# Patient Record
Sex: Male | Born: 1980 | Race: White | Hispanic: No | Marital: Single | Smoking: Never smoker
Health system: Southern US, Community
[De-identification: ages and names within clinical notes are randomized; demographics above are authoritative.]

## PROBLEM LIST (undated history)

## (undated) DIAGNOSIS — E119 Type 2 diabetes mellitus without complications: Secondary | ICD-10-CM

---

## 2008-03-20 ENCOUNTER — Emergency Department (HOSPITAL_COMMUNITY): Admission: EM | Admit: 2008-03-20 | Discharge: 2008-03-20 | Payer: Self-pay | Admitting: Emergency Medicine

## 2011-02-09 LAB — RAPID URINE DRUG SCREEN, HOSP PERFORMED
Amphetamines: NOT DETECTED
Barbiturates: NOT DETECTED
Benzodiazepines: NOT DETECTED
Cocaine: NOT DETECTED
Opiates: NOT DETECTED
Tetrahydrocannabinol: NOT DETECTED

## 2011-02-09 LAB — DIFFERENTIAL
Lymphocytes Relative: 22
Monocytes Absolute: 0.4
Monocytes Relative: 6
Neutro Abs: 4.5

## 2011-02-09 LAB — COMPREHENSIVE METABOLIC PANEL
ALT: 24
AST: 16
Albumin: 4
Alkaline Phosphatase: 42
BUN: 15
CO2: 27
Calcium: 9.1
Chloride: 102
Creatinine, Ser: 0.71
GFR calc Af Amer: >60
GFR calc non Af Amer: >60
Glucose, Bld: 352 — ABNORMAL HIGH
Potassium: 4
Sodium: 137
Total Bilirubin: 0.9
Total Protein: 5.9 — ABNORMAL LOW

## 2011-02-09 LAB — CBC
Hemoglobin: 15.2
RBC: 4.88

## 2011-02-09 LAB — ETHANOL: Alcohol, Ethyl (B): <5

## 2017-10-18 ENCOUNTER — Other Ambulatory Visit (INDEPENDENT_AMBULATORY_CARE_PROVIDER_SITE_OTHER): Payer: Self-pay | Admitting: Otolaryngology

## 2017-10-18 DIAGNOSIS — J328 Other chronic sinusitis: Secondary | ICD-10-CM

## 2017-11-18 ENCOUNTER — Ambulatory Visit
Admission: RE | Admit: 2017-11-18 | Discharge: 2017-11-18 | Disposition: A | Discharge status: Home / Self Care | Payer: BLUE CROSS/BLUE SHIELD | Source: Ambulatory Visit | Attending: Otolaryngology | Admitting: Otolaryngology

## 2017-11-18 DIAGNOSIS — J328 Other chronic sinusitis: Secondary | ICD-10-CM

## 2017-12-28 ENCOUNTER — Other Ambulatory Visit: Payer: Self-pay | Admitting: Otolaryngology

## 2018-01-24 ENCOUNTER — Other Ambulatory Visit: Payer: Self-pay

## 2018-01-24 ENCOUNTER — Encounter (HOSPITAL_BASED_OUTPATIENT_CLINIC_OR_DEPARTMENT_OTHER): Payer: Self-pay | Admitting: *Deleted

## 2018-01-24 NOTE — Progress Notes (Signed)
Contacted the diabetes coordinator regarding the pts upcoming surgery. Her recommendations for the pt were for him to contact his endocrinologist for any  recommendations that they might have for him. Otherwise her recommendations were to reduce his basal rate of 0.7units per hour by 20% at midnight the night before his surgery. Will contact the pt and let him know these recommendations.

## 2018-01-24 NOTE — Progress Notes (Signed)
Called pt and left voice mail message on his cell phone to contact his endocrinologist for their recommendations on how he should manage his insulin pump for surgery, otherwise our diabetes coordinator recommends for him to reduce his basal rate by 20% at midnight the night before his surgery. Advised to call our office back if he had any further questions

## 2018-01-25 ENCOUNTER — Encounter (HOSPITAL_BASED_OUTPATIENT_CLINIC_OR_DEPARTMENT_OTHER)
Admission: RE | Admit: 2018-01-25 | Discharge: 2018-01-25 | Disposition: A | Discharge status: Home / Self Care | Payer: BLUE CROSS/BLUE SHIELD | Source: Ambulatory Visit | Attending: Otolaryngology | Admitting: Otolaryngology

## 2018-01-25 DIAGNOSIS — J3489 Other specified disorders of nose and nasal sinuses: Secondary | ICD-10-CM | POA: Diagnosis present

## 2018-01-25 DIAGNOSIS — Z794 Long term (current) use of insulin: Secondary | ICD-10-CM | POA: Diagnosis not present

## 2018-01-25 DIAGNOSIS — J320 Chronic maxillary sinusitis: Secondary | ICD-10-CM | POA: Diagnosis not present

## 2018-01-25 DIAGNOSIS — E119 Type 2 diabetes mellitus without complications: Secondary | ICD-10-CM | POA: Diagnosis not present

## 2018-01-25 DIAGNOSIS — J338 Other polyp of sinus: Secondary | ICD-10-CM | POA: Diagnosis not present

## 2018-01-25 DIAGNOSIS — J322 Chronic ethmoidal sinusitis: Secondary | ICD-10-CM | POA: Diagnosis not present

## 2018-01-25 LAB — BASIC METABOLIC PANEL
Anion gap: 8 (ref 5–15)
BUN: 22 mg/dL — ABNORMAL HIGH (ref 6–20)
CALCIUM: 9 mg/dL (ref 8.9–10.3)
CO2: 27 mmol/L (ref 22–32)
CREATININE: 1.08 mg/dL (ref 0.61–1.24)
Chloride: 103 mmol/L (ref 98–111)
GFR calc non Af Amer: >60 mL/min (ref >60)
GLUCOSE: 310 mg/dL — AB (ref 70–99)
Potassium: 4.1 mmol/L (ref 3.5–5.1)
Sodium: 138 mmol/L (ref 135–145)

## 2018-01-26 NOTE — Progress Notes (Signed)
Glucose= 310, Dr. Hyacinth MeekerMiller aware, pt notified, will proceed with surgery as scheduled.

## 2018-01-30 ENCOUNTER — Encounter (HOSPITAL_BASED_OUTPATIENT_CLINIC_OR_DEPARTMENT_OTHER)
Admission: RE | Disposition: A | Discharge status: Home / Self Care | Payer: Self-pay | Source: Ambulatory Visit | Attending: Otolaryngology

## 2018-01-30 ENCOUNTER — Ambulatory Visit (HOSPITAL_BASED_OUTPATIENT_CLINIC_OR_DEPARTMENT_OTHER): Payer: BLUE CROSS/BLUE SHIELD | Admitting: Certified Registered"

## 2018-01-30 ENCOUNTER — Ambulatory Visit (HOSPITAL_BASED_OUTPATIENT_CLINIC_OR_DEPARTMENT_OTHER)
Admission: RE | Admit: 2018-01-30 | Discharge: 2018-01-30 | Disposition: A | Discharge status: Home / Self Care | Payer: BLUE CROSS/BLUE SHIELD | Source: Ambulatory Visit | Attending: Otolaryngology | Admitting: Otolaryngology

## 2018-01-30 DIAGNOSIS — J3489 Other specified disorders of nose and nasal sinuses: Secondary | ICD-10-CM | POA: Insufficient documentation

## 2018-01-30 DIAGNOSIS — J322 Chronic ethmoidal sinusitis: Secondary | ICD-10-CM | POA: Diagnosis not present

## 2018-01-30 DIAGNOSIS — J320 Chronic maxillary sinusitis: Secondary | ICD-10-CM | POA: Insufficient documentation

## 2018-01-30 DIAGNOSIS — J338 Other polyp of sinus: Secondary | ICD-10-CM | POA: Insufficient documentation

## 2018-01-30 DIAGNOSIS — E119 Type 2 diabetes mellitus without complications: Secondary | ICD-10-CM | POA: Insufficient documentation

## 2018-01-30 DIAGNOSIS — Z794 Long term (current) use of insulin: Secondary | ICD-10-CM | POA: Insufficient documentation

## 2018-01-30 HISTORY — PX: ETHMOIDECTOMY: SHX5197

## 2018-01-30 HISTORY — DX: Type 2 diabetes mellitus without complications: E11.9

## 2018-01-30 HISTORY — PX: SINUS ENDO WITH FUSION: SHX5329

## 2018-01-30 HISTORY — PX: MAXILLARY ANTROSTOMY: SHX2003

## 2018-01-30 LAB — GLUCOSE, CAPILLARY
GLUCOSE-CAPILLARY: 215 mg/dL — AB (ref 70–99)
Glucose-Capillary: 174 mg/dL — ABNORMAL HIGH (ref 70–99)
Glucose-Capillary: 174 mg/dL — ABNORMAL HIGH (ref 70–99)

## 2018-01-30 SURGERY — ETHMOIDECTOMY
Anesthesia: General | Site: Nose | Laterality: Bilateral

## 2018-01-30 MED ORDER — HYDROMORPHONE HCL 1 MG/ML IJ SOLN: 0.2500 mg | INTRAMUSCULAR | Status: DC | PRN

## 2018-01-30 MED ORDER — ROCURONIUM BROMIDE 50 MG/5ML IV SOSY
PREFILLED_SYRINGE | INTRAVENOUS | Status: AC
  Filled 2018-01-30: qty 5

## 2018-01-30 MED ORDER — AMOXICILLIN 875 MG PO TABS: 875.0000 mg | ORAL_TABLET | Freq: Two times a day (BID) | ORAL | 0 refills | Status: AC

## 2018-01-30 MED ORDER — OXYCODONE HCL 5 MG PO TABS
5.0000 mg | ORAL_TABLET | Freq: Once | ORAL | Status: AC | PRN
  Administered 2018-01-30: 5 mg via ORAL

## 2018-01-30 MED ORDER — OXYMETAZOLINE HCL 0.05 % NA SOLN
NASAL | Status: DC | PRN
  Administered 2018-01-30: 1 via TOPICAL

## 2018-01-30 MED ORDER — FENTANYL CITRATE (PF) 100 MCG/2ML IJ SOLN
INTRAMUSCULAR | Status: AC
  Filled 2018-01-30: qty 2

## 2018-01-30 MED ORDER — PROMETHAZINE HCL 25 MG/ML IJ SOLN: 6.2500 mg | INTRAMUSCULAR | Status: DC | PRN

## 2018-01-30 MED ORDER — EPHEDRINE SULFATE 50 MG/ML IJ SOLN
INTRAMUSCULAR | Status: DC | PRN
  Administered 2018-01-30: 10 mg via INTRAVENOUS

## 2018-01-30 MED ORDER — ROCURONIUM BROMIDE 100 MG/10ML IV SOLN
INTRAVENOUS | Status: DC | PRN
  Administered 2018-01-30: 50 mg via INTRAVENOUS

## 2018-01-30 MED ORDER — MIDAZOLAM HCL 2 MG/2ML IJ SOLN
1.0000 mg | INTRAMUSCULAR | Status: DC | PRN
  Administered 2018-01-30: 2 mg via INTRAVENOUS

## 2018-01-30 MED ORDER — ONDANSETRON HCL 4 MG/2ML IJ SOLN
INTRAMUSCULAR | Status: DC | PRN
  Administered 2018-01-30: 4 mg via INTRAVENOUS

## 2018-01-30 MED ORDER — SUGAMMADEX SODIUM 500 MG/5ML IV SOLN
INTRAVENOUS | Status: DC | PRN
  Administered 2018-01-30: 400 mg via INTRAVENOUS

## 2018-01-30 MED ORDER — MIDAZOLAM HCL 2 MG/2ML IJ SOLN
INTRAMUSCULAR | Status: AC
  Filled 2018-01-30: qty 2

## 2018-01-30 MED ORDER — ONDANSETRON HCL 4 MG/2ML IJ SOLN
INTRAMUSCULAR | Status: AC
  Filled 2018-01-30: qty 2

## 2018-01-30 MED ORDER — FENTANYL CITRATE (PF) 100 MCG/2ML IJ SOLN
50.0000 ug | INTRAMUSCULAR | Status: DC | PRN
  Administered 2018-01-30: 100 ug via INTRAVENOUS

## 2018-01-30 MED ORDER — GLYCOPYRROLATE 0.2 MG/ML IJ SOLN
INTRAMUSCULAR | Status: DC | PRN
  Administered 2018-01-30: 0.2 mg via INTRAVENOUS

## 2018-01-30 MED ORDER — PROPOFOL 10 MG/ML IV BOLUS
INTRAVENOUS | Status: AC
  Filled 2018-01-30: qty 20

## 2018-01-30 MED ORDER — DEXAMETHASONE SODIUM PHOSPHATE 4 MG/ML IJ SOLN
INTRAMUSCULAR | Status: DC | PRN
  Administered 2018-01-30: 8 mg via INTRAVENOUS

## 2018-01-30 MED ORDER — GLYCOPYRROLATE PF 0.2 MG/ML IJ SOSY
PREFILLED_SYRINGE | INTRAMUSCULAR | Status: AC
  Filled 2018-01-30: qty 1

## 2018-01-30 MED ORDER — MUPIROCIN 2 % EX OINT
TOPICAL_OINTMENT | CUTANEOUS | Status: DC | PRN
  Administered 2018-01-30: 1 via NASAL

## 2018-01-30 MED ORDER — CEFAZOLIN SODIUM-DEXTROSE 2-3 GM-%(50ML) IV SOLR
INTRAVENOUS | Status: DC | PRN
  Administered 2018-01-30: 2 g via INTRAVENOUS

## 2018-01-30 MED ORDER — PROPOFOL 10 MG/ML IV BOLUS
INTRAVENOUS | Status: DC | PRN
  Administered 2018-01-30: 150 mg via INTRAVENOUS

## 2018-01-30 MED ORDER — OXYCODONE-ACETAMINOPHEN 5-325 MG PO TABS: 1.0000 | ORAL_TABLET | ORAL | 0 refills | Status: AC | PRN

## 2018-01-30 MED ORDER — LACTATED RINGERS IV SOLN
INTRAVENOUS | Status: DC
  Administered 2018-01-30 (×2): via INTRAVENOUS

## 2018-01-30 MED ORDER — DEXAMETHASONE SODIUM PHOSPHATE 10 MG/ML IJ SOLN
INTRAMUSCULAR | Status: AC
  Filled 2018-01-30: qty 1

## 2018-01-30 MED ORDER — OXYCODONE HCL 5 MG/5ML PO SOLN: 5.0000 mg | Freq: Once | ORAL | Status: AC | PRN

## 2018-01-30 MED ORDER — SCOPOLAMINE 1 MG/3DAYS TD PT72: 1.0000 | MEDICATED_PATCH | Freq: Once | TRANSDERMAL | Status: DC | PRN

## 2018-01-30 MED ORDER — OXYCODONE HCL 5 MG PO TABS
ORAL_TABLET | ORAL | Status: AC
  Filled 2018-01-30: qty 1

## 2018-01-30 SURGICAL SUPPLY — 60 items
ATTRACTOMAT 16X20 MAGNETIC DRP (DRAPES) IMPLANT
BLADE RAD40 ROTATE 4M 4 5PK (BLADE) IMPLANT
BLADE RAD40 ROTATE 4M 4MM 5PK (BLADE)
BLADE RAD60 ROTATE M4 4 5PK (BLADE) IMPLANT
BLADE RAD60 ROTATE M4 4MM 5PK (BLADE)
BLADE ROTATE RAD 12 4 M4 (BLADE) IMPLANT
BLADE ROTATE RAD 12 4MM M4 (BLADE)
BLADE ROTATE RAD 40 4 M4 (BLADE) IMPLANT
BLADE ROTATE RAD 40 4MM M4 (BLADE)
BLADE ROTATE TRICUT 4MX13CM M4 (BLADE) ×1
BLADE ROTATE TRICUT 4X13 M4 (BLADE) ×2 IMPLANT
BLADE TRICUT ROTATE M4 4 5PK (BLADE) IMPLANT
BLADE TRICUT ROTATE M4 4MM 5PK (BLADE)
BUR HS RAD FRONTAL 3 (BURR) IMPLANT
BUR HS RAD FRONTAL 3MM (BURR)
CANISTER SUC SOCK COL 7IN (MISCELLANEOUS) ×6 IMPLANT
CANISTER SUCT 1200ML W/VALVE (MISCELLANEOUS) ×3 IMPLANT
COAGULATOR SUCT 6 FR SWTCH (ELECTROSURGICAL)
COAGULATOR SUCT 8FR VV (MISCELLANEOUS) ×3 IMPLANT
COAGULATOR SUCT SWTCH 10FR 6 (ELECTROSURGICAL) IMPLANT
CONT SPEC 4OZ CLIKSEAL STRL BL (MISCELLANEOUS) ×3 IMPLANT
DECANTER SPIKE VIAL GLASS SM (MISCELLANEOUS) IMPLANT
DRSG NASAL KENNEDY LMNT 8CM (GAUZE/BANDAGES/DRESSINGS) IMPLANT
DRSG NASOPORE 8CM (GAUZE/BANDAGES/DRESSINGS) ×3 IMPLANT
DRSG TELFA 3X8 NADH (GAUZE/BANDAGES/DRESSINGS) IMPLANT
ELECT REM PT RETURN 9FT ADLT (ELECTROSURGICAL) ×3
ELECTRODE REM PT RTRN 9FT ADLT (ELECTROSURGICAL) ×1 IMPLANT
GLOVE BIO SURGEON STRL SZ 6.5 (GLOVE) ×4 IMPLANT
GLOVE BIO SURGEON STRL SZ7.5 (GLOVE) ×3 IMPLANT
GLOVE BIO SURGEONS STRL SZ 6.5 (GLOVE) ×2
GLOVE BIOGEL PI IND STRL 7.0 (GLOVE) ×1 IMPLANT
GLOVE BIOGEL PI INDICATOR 7.0 (GLOVE) ×2
GOWN STRL REUS W/ TWL LRG LVL3 (GOWN DISPOSABLE) ×3 IMPLANT
GOWN STRL REUS W/TWL LRG LVL3 (GOWN DISPOSABLE) ×6
HEMOSTAT SURGICEL 2X14 (HEMOSTASIS) IMPLANT
IV NS 1000ML (IV SOLUTION)
IV NS 1000ML BAXH (IV SOLUTION) IMPLANT
IV NS 500ML (IV SOLUTION) ×2
IV NS 500ML BAXH (IV SOLUTION) ×1 IMPLANT
NEEDLE HYPO 25X1 1.5 SAFETY (NEEDLE) ×3 IMPLANT
NEEDLE PRECISIONGLIDE 27X1.5 (NEEDLE) IMPLANT
NEEDLE SPNL 25GX3.5 QUINCKE BL (NEEDLE) IMPLANT
NS IRRIG 1000ML POUR BTL (IV SOLUTION) ×3 IMPLANT
PACK BASIN DAY SURGERY FS (CUSTOM PROCEDURE TRAY) ×3 IMPLANT
PACK ENT DAY SURGERY (CUSTOM PROCEDURE TRAY) ×3 IMPLANT
PACKING NASAL EPIS 4X2.4 XEROG (MISCELLANEOUS) IMPLANT
SLEEVE SCD COMPRESS KNEE MED (MISCELLANEOUS) ×3 IMPLANT
SOLUTION BUTLER CLEAR DIP (MISCELLANEOUS) ×3 IMPLANT
SPLINT NASAL AIRWAY SILICONE (MISCELLANEOUS) IMPLANT
SPONGE GAUZE 2X2 8PLY STER LF (GAUZE/BANDAGES/DRESSINGS) ×1
SPONGE GAUZE 2X2 8PLY STRL LF (GAUZE/BANDAGES/DRESSINGS) ×2 IMPLANT
SPONGE NEURO XRAY DETECT 1X3 (DISPOSABLE) ×3 IMPLANT
TOWEL GREEN STERILE FF (TOWEL DISPOSABLE) ×3 IMPLANT
TRACKER ENT INSTRUMENT (MISCELLANEOUS) ×3 IMPLANT
TRACKER ENT PATIENT (MISCELLANEOUS) ×3 IMPLANT
TUBE CONNECTING 20'X1/4 (TUBING) ×1
TUBE CONNECTING 20X1/4 (TUBING) ×2 IMPLANT
TUBE SALEM SUMP 16 FR W/ARV (TUBING) IMPLANT
TUBING STRAIGHTSHOT EPS 5PK (TUBING) ×3 IMPLANT
YANKAUER SUCT BULB TIP NO VENT (SUCTIONS) ×3 IMPLANT

## 2018-01-30 NOTE — Anesthesia Preprocedure Evaluation (Signed)
Anesthesia Evaluation  Patient identified by MRN, date of birth, ID band Patient awake    Reviewed: Allergy & Precautions, NPO status , Patient's Chart, lab work & pertinent test results  Airway Mallampati: II  TM Distance: >3 FB Neck ROM: Full    Dental no notable dental hx.    Pulmonary neg pulmonary ROS,    Pulmonary exam normal breath sounds clear to auscultation       Cardiovascular negative cardio ROS Normal cardiovascular exam Rhythm:Regular Rate:Normal     Neuro/Psych negative neurological ROS  negative psych ROS   GI/Hepatic negative GI ROS, Neg liver ROS,   Endo/Other  diabetes, Insulin Dependent  Renal/GU negative Renal ROS     Musculoskeletal negative musculoskeletal ROS (+)   Abdominal   Peds  Hematology HLD   Anesthesia Other Findings ETHMOID SINUSITIS MAXILLARY SINUSITIS  Reproductive/Obstetrics                             Anesthesia Physical Anesthesia Plan  ASA: II  Anesthesia Plan: General   Post-op Pain Management:    Induction: Intravenous  PONV Risk Score and Plan: 2 and Ondansetron, Midazolam and Treatment may vary due to age or medical condition  Airway Management Planned: Oral ETT  Additional Equipment:   Intra-op Plan:   Post-operative Plan: Extubation in OR  Informed Consent: I have reviewed the patients History and Physical, chart, labs and discussed the procedure including the risks, benefits and alternatives for the proposed anesthesia with the patient or authorized representative who has indicated his/her understanding and acceptance.   Dental advisory given  Plan Discussed with: CRNA  Anesthesia Plan Comments:         Anesthesia Quick Evaluation

## 2018-01-30 NOTE — Anesthesia Procedure Notes (Signed)
Procedure Name: Intubation Performed by: Verita Lamb, CRNA Pre-anesthesia Checklist: Patient identified, Emergency Drugs available, Suction available, Patient being monitored and Timeout performed Patient Re-evaluated:Patient Re-evaluated prior to induction Oxygen Delivery Method: Circle system utilized Preoxygenation: Pre-oxygenation with 100% oxygen Induction Type: IV induction Ventilation: Mask ventilation without difficulty Laryngoscope Size: Mac and 4 Grade View: Grade I Tube type: Oral Tube size: 7.0 mm Number of attempts: 1 Airway Equipment and Method: Stylet Placement Confirmation: ETT inserted through vocal cords under direct vision,  positive ETCO2,  CO2 detector and breath sounds checked- equal and bilateral Secured at: 24 cm Tube secured with: Tape Dental Injury: Teeth and Oropharynx as per pre-operative assessment

## 2018-01-30 NOTE — Anesthesia Postprocedure Evaluation (Signed)
Anesthesia Post Note  Patient: Timothy JabsMark Larson  Procedure(s) Performed: BILATERAL ENDOSCOPIC ETHMOIDECTOMY (Bilateral Nose) BILATERAL MAXILLARY ANTROSTOMY WITH TISSUE REMOVAL (Bilateral Nose) SINUS ENDOSCOPY WITH FUSION NAVIGATION (Bilateral Nose)     Patient location during evaluation: PACU Anesthesia Type: General Level of consciousness: awake and alert Pain management: pain level controlled Vital Signs Assessment: post-procedure vital signs reviewed and stable Respiratory status: spontaneous breathing, nonlabored ventilation, respiratory function stable and patient connected to nasal cannula oxygen Cardiovascular status: blood pressure returned to baseline and stable Postop Assessment: no apparent nausea or vomiting Anesthetic complications: no    Last Vitals:  Vitals:   01/30/18 1315 01/30/18 1333  BP: 133/87 135/80  Pulse: 62 71  Resp: 14 16  Temp:  36.9 C  SpO2: 100% 99%    Last Pain:  Vitals:   01/30/18 1333  TempSrc:   PainSc: 4                  Ryan P Ellender

## 2018-01-30 NOTE — Discharge Instructions (Addendum)

## 2018-01-30 NOTE — H&P (Signed)
Cc: Sinonasal polyps, anosmia, chronic sinusitis  HPI: The patient is a 37 year old male who returns today for his follow-up evaluation.  The patient has a history of chronic anosmia and dysgeusia.  He was previously noted to have significant nasal mucosal edema and sinonasal polyps obstructing the superior nasal cavity and the middle meatus bilaterally.  The patient was previously treated with Flonase nasal spray, Augmentin and Prednisone Dosepak.  He also underwent allergy evaluation, with no significant allergies noted.  Despite treatment, he continues to be symptomatic.  He recently underwent a CT scan.  The CT showed partial opacification of his maxillary and ethmoid sinuses bilaterally.  Polypoid tissue was also noted to obstruct both ostiomeatal complexes.  Both middle turbinates are significantly hypertrophied. The patient returns today reporting no improvement in his symptoms.  He continues to have significant difficulty with his anosmia and dysgeusia.  He is interested in more definitive treatment.   Exam: The nasal cavities were decongested and anesthetised with a combination of oxymetazoline and 4% lidocaine solution. The flexible scope was inserted into the right nasal cavity. Endoscopy of the inferior and middle meatus was performed. Severely edematous mucosa was noted. Polypoid tissue was noted to obstruct his superior nasal cavity and middle meatus. Nasopharynx was clear. Turbinates were hypertrophied but without mass.  The procedure was repeated on the contralateral side with similar findings. The patient tolerated the procedure well. Instructions were given to avoid eating or drinking for 2 hours.   Assessment: 1.  Bilateral chronic maxillary and ethmoid sinusitis, with sinonasal polyps obstructing the ostiomeatal units.  Both middle turbinates are also significantly hypertrophied.   2.  The patient has not responded to medical treatment so far.   Plan: 1.  The nasal endoscopy findings  and the CT images are extensively reviewed with the patient.  2.  The patient should continue with Flonase nasal spray.   3.  In light of his persistent symptoms, he will most likely benefit from surgical intervention with bilateral endoscopic sinus surgery, involving the maxillary and ethmoid sinuses.  Hi sinonasal polyps will need to be removed, with partial resection of his middle turbinates.   4.  The risks, benefits, and details of the procedure are reviewed with the patient.  He would like to proceed with the procedures.

## 2018-01-30 NOTE — Transfer of Care (Signed)
Immediate Anesthesia Transfer of Care Note  Patient: Timothy JabsMark Nevares  Procedure(s) Performed: BILATERAL ENDOSCOPIC ETHMOIDECTOMY (Bilateral Nose) BILATERAL MAXILLARY ANTROSTOMY WITH TISSUE REMOVAL (Bilateral Nose) SINUS ENDOSCOPY WITH FUSION NAVIGATION (Bilateral Nose)  Patient Location: PACU  Anesthesia Type:General  Level of Consciousness: awake, alert  and oriented  Airway & Oxygen Therapy: Patient Spontanous Breathing and Patient connected to face mask oxygen  Post-op Assessment: Report given to RN and Post -op Vital signs reviewed and stable  Post vital signs: Reviewed and stable  Last Vitals:  Vitals Value Taken Time  BP 127/82 01/30/2018 12:40 PM  Temp    Pulse 71 01/30/2018 12:42 PM  Resp 11 01/30/2018 12:42 PM  SpO2 100 % 01/30/2018 12:42 PM  Vitals shown include unvalidated device data.  Last Pain:  Vitals:   01/30/18 0947  TempSrc: Oral  PainSc: 0-No pain         Complications: No apparent anesthesia complications

## 2018-01-30 NOTE — Op Note (Signed)
DATE OF PROCEDURE: 01/30/2018  OPERATIVE REPORT   SURGEON: Newman PiesSu Kennie Snedden, MD   PREOPERATIVE DIAGNOSES:  1. Chronic nasal obstruction. 2. Bilateral sinonasal polyps. 3. Bilateral chronic maxillary and ethmoid sinusitis.  POSTOPERATIVE DIAGNOSES:  1. Chronic nasal obstruction. 2. Bilateral sinonasal polyps. 3. Bilateral chronic maxillary and ethmoid sinusitis.  PROCEDURE PERFORMED:  1. Bilateral endoscopic total ethmoidectomy 2. Bilateral endoscopic maxillary antrostomy with polyp removal 3. FUSION stereotactic image guidance  ANESTHESIA: General endotracheal tube anesthesia.   COMPLICATIONS: None.   ESTIMATED BLOOD LOSS: 200 mL  INDICATION FOR PROCEDURE: Timothy Larson is a 37 y.o. male with a history of chronic anosmia and dysgeusia. He was previously noted to have significant nasal mucosal edema and sinonasal polyps, obstructing the superior nasal cavity and the middle meatus bilaterally. The patient was treated with multiple courses of antibiotics, systemic and topical steroids, and allergy medications. Despite the treatment, he continued to be symptomatic. His most recent CT scan showed partial opacification of his maxillary and ethmoid sinuses bilaterally. Polypoid tissue was noted to obstruct both ostiomeatal complexes. Both middle turbinates were significantly hypertrophied. Based on the above findings, the decision was made for the patient to undergo the above-stated procedures. The risks, benefits, alternatives, and details of the procedures were discussed with the patient. Questions were invited and answered. Informed consent was obtained.   DESCRIPTION OF PROCEDURE: The patient was taken to the operating room and placed supine on the operating table. General endotracheal tube anesthesia was administered by the anesthesiologist. The patient was positioned, and prepped and draped in the standard fashion for nasal surgery. Pledgets soaked with Afrin were placed in both nasal cavities for  decongestion. The pledgets were subsequently removed. The FUSION stereotactic image guidance marker was placed. The image guidance system was functional throughout the case.  Attention was first focused on the left nasal cavity. Using a 0 scope, the left nasal cavity was examined. Polypoid tissue was noted to obstruct the superior half of the nasal cavity. The middle turbinate was severely hypertrophied, and was covered in polypoid tissue. The polypoid tissue was removed in a piecemeal fashion using a combination of Tru-Cut forceps, Blakesley forceps, and microdebrider. The inferior one half of the middle turbinate was resected using Tru-Cut forceps.  The uncinate process was resected with a freer elevator. The maxillary antrum was entered and enlarged using a combination of backbiting forceps and microdebrider. Polypoid tissue was removed from the left maxillary sinus. Attention was then focused on the ethmoid sinuses. The bony partitions of the anterior and posterior ethmoid cavities were taken down to using the microdebrider. Polypoid tissue was removed from the ethmoid cavities. The surgical areas were copiously irrigated. Hemostasis was achieved using the nasopore packing. The same procedure was repeated on the right side without exception. Similar findings were noted on the right side.  The care of the patient was turned over to the anesthesiologist. The patient was awakened from anesthesia without difficulty. The patient was extubated and transferred to the recovery room in good condition.   OPERATIVE FINDINGS: Bilateral sinonasal polyps, obstructing the superior nasal cavity, and maxillary and ethmoid sinuses.  SPECIMEN: Bilateral sinus contents.  FOLLOWUP CARE: The patient be discharged home once he is awake and alert. The patient will be placed on Percocet 1 tablet p.o. q.4 hours p.r.n. pain, and amoxicillin 875 mg p.o. b.i.d. for 5 days. The patient will follow up in my office in approximately  1 week for sinus debridement.   Jamesen Stahnke Philomena DohenyWooi Saburo Luger, MD

## 2018-01-31 ENCOUNTER — Encounter (HOSPITAL_BASED_OUTPATIENT_CLINIC_OR_DEPARTMENT_OTHER): Payer: Self-pay | Admitting: Otolaryngology

## 2018-08-01 ENCOUNTER — Telehealth: Payer: Self-pay | Admitting: *Deleted

## 2018-08-01 NOTE — Telephone Encounter (Signed)
Called pt and left message for PCP to obtain medical records.

## 2018-08-07 ENCOUNTER — Telehealth: Payer: Self-pay | Admitting: *Deleted

## 2018-08-07 NOTE — Telephone Encounter (Signed)
-----   Message from Lyn Records, MD sent at 08/07/2018  1:29 PM EDT ----- Regarding: New office visit for chest pain I left a voicemail.  We will need to call him back and get some idea of the chest pain story.  If it sounds like angina he will need to come into the office because we will need to do an EKG.  I do not feel that virtual visit is appropriate in the setting of chest pain.

## 2018-08-07 NOTE — Telephone Encounter (Signed)
Left message to call back  

## 2018-08-07 NOTE — Telephone Encounter (Signed)
Pt called back and cancelled appt.  He was a self referral.  Pt is going to go see PCP first and see if Cardiology is needed.

## 2018-08-10 ENCOUNTER — Ambulatory Visit: Payer: BLUE CROSS/BLUE SHIELD | Admitting: Interventional Cardiology

## 2018-08-10 ENCOUNTER — Encounter

## 2019-02-23 DIAGNOSIS — E7800 Pure hypercholesterolemia, unspecified: Secondary | ICD-10-CM | POA: Diagnosis not present

## 2019-02-23 DIAGNOSIS — E109 Type 1 diabetes mellitus without complications: Secondary | ICD-10-CM | POA: Diagnosis not present

## 2019-02-23 MED FILL — ATORVASTATIN 20 MG TABLET: 20 | 90 days supply | Qty: 90 | Fill #0

## 2019-02-27 MED FILL — HumaLOG 100 UNIT/ML SOLN: 100 | 90 days supply | Qty: 90 | Fill #0

## 2019-03-09 DIAGNOSIS — E1065 Type 1 diabetes mellitus with hyperglycemia: Secondary | ICD-10-CM | POA: Diagnosis not present

## 2019-03-09 DIAGNOSIS — Z794 Long term (current) use of insulin: Secondary | ICD-10-CM | POA: Diagnosis not present

## 2019-03-09 DIAGNOSIS — E109 Type 1 diabetes mellitus without complications: Secondary | ICD-10-CM | POA: Diagnosis not present

## 2019-04-06 DIAGNOSIS — R5383 Other fatigue: Secondary | ICD-10-CM | POA: Diagnosis not present

## 2019-04-06 DIAGNOSIS — R197 Diarrhea, unspecified: Secondary | ICD-10-CM | POA: Diagnosis not present

## 2019-04-06 DIAGNOSIS — E109 Type 1 diabetes mellitus without complications: Secondary | ICD-10-CM | POA: Diagnosis not present

## 2019-04-06 DIAGNOSIS — R5381 Other malaise: Secondary | ICD-10-CM | POA: Diagnosis not present

## 2019-04-06 DIAGNOSIS — Z862 Personal history of diseases of the blood and blood-forming organs and certain disorders involving the immune mechanism: Secondary | ICD-10-CM | POA: Diagnosis not present

## 2019-05-10 DIAGNOSIS — Z794 Long term (current) use of insulin: Secondary | ICD-10-CM | POA: Diagnosis not present

## 2019-05-10 DIAGNOSIS — E1065 Type 1 diabetes mellitus with hyperglycemia: Secondary | ICD-10-CM | POA: Diagnosis not present

## 2019-05-10 DIAGNOSIS — E109 Type 1 diabetes mellitus without complications: Secondary | ICD-10-CM | POA: Diagnosis not present

## 2019-08-02 MED FILL — CONTOUR NEXT STRIPS: 81 days supply | Qty: 650 | Fill #0

## 2019-09-07 DIAGNOSIS — E109 Type 1 diabetes mellitus without complications: Secondary | ICD-10-CM | POA: Diagnosis not present

## 2019-09-07 MED FILL — NovoLOG 100 UNIT/ML SOLN: 100 | 90 days supply | Qty: 90 | Fill #0

## 2019-09-07 MED FILL — DEXCOM G6 SENSOR MISC: 30 days supply | Qty: 3 | Fill #0

## 2019-09-07 MED FILL — DEXCOM G6 TRANSMITTER MISC: 90 days supply | Qty: 1 | Fill #0

## 2019-09-10 MED FILL — NovoLOG 100 UNIT/ML SOLN: 100 | 90 days supply | Qty: 90 | Fill #0

## 2019-09-10 MED FILL — CONTOUR NEXT STRIPS: 81 days supply | Qty: 650 | Fill #0

## 2020-01-20 IMAGING — CT CT MAXILLOFACIAL W/O CM
1 series · 15 of 30 positions shown, 19 images · non-contrast
Comparison: None.

CLINICAL DATA: Loss of smell and taste over the last year.
Preoperative fusion protocol.

EXAM:
CT MAXILLOFACIAL WITHOUT CONTRAST
TECHNIQUE: Multidetector CT images of the paranasal sinuses were obtained using
the standard protocol without intravenous contrast.

[Series 4: soft tissue · axial · 0.48mm/px · z∈[-211,-42]mm · 15 of 183 slices shown, 19 images]
[im 7/183  brain]
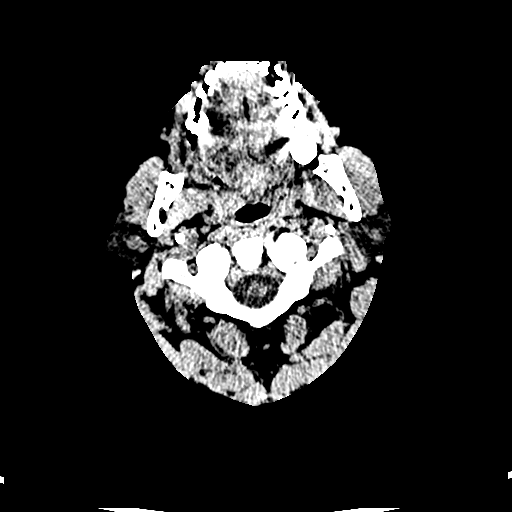
[im 7/183  bone]
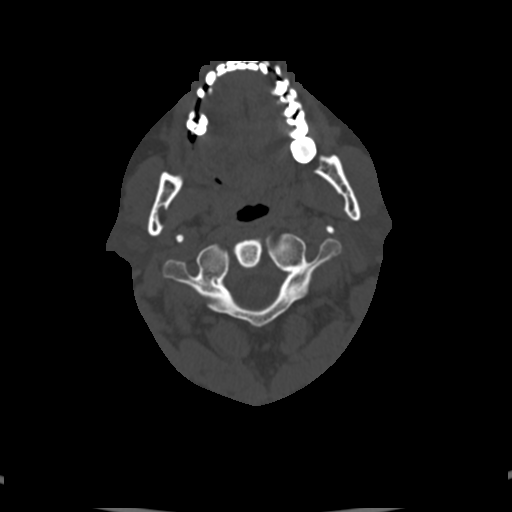
[im 19/183  bone]
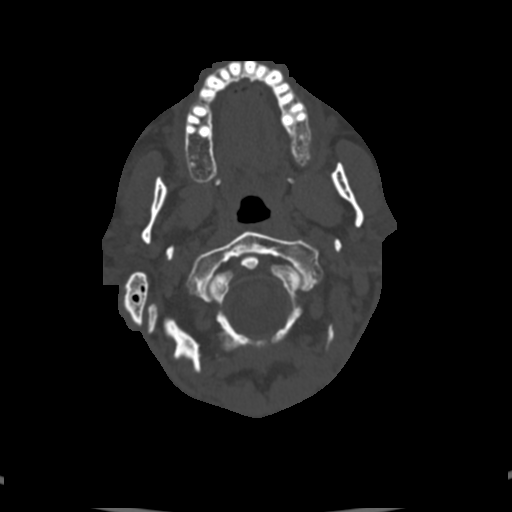
[im 32/183  bone]
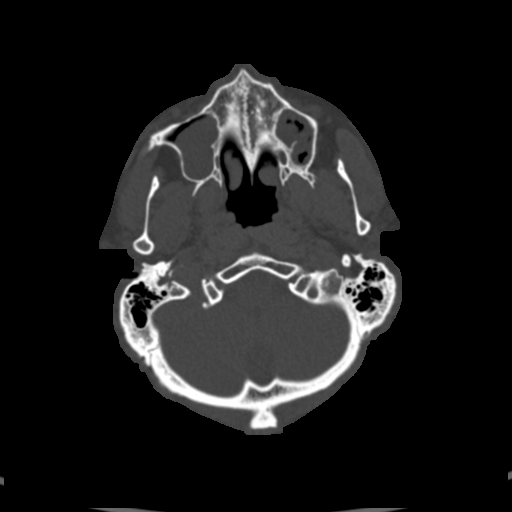
[im 44/183  bone]
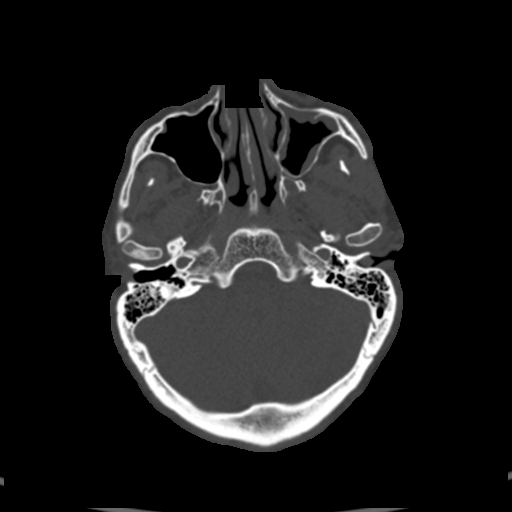
[im 57/183  brain]
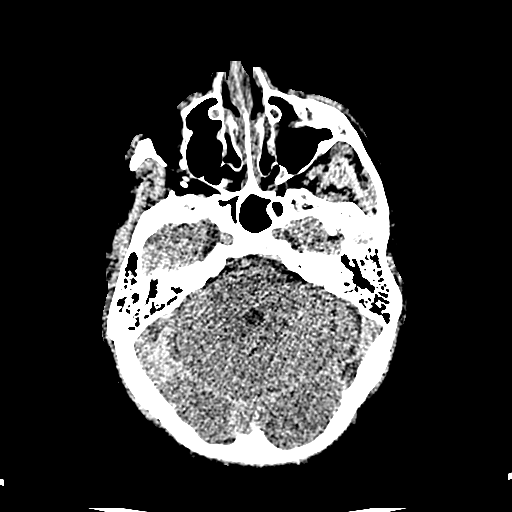
[im 57/183  bone]
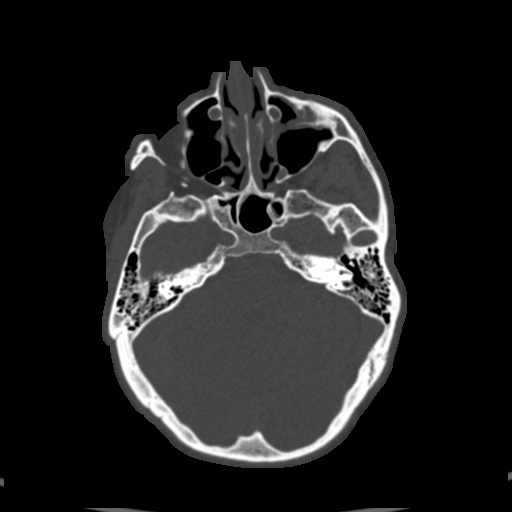
[im 70/183  bone]
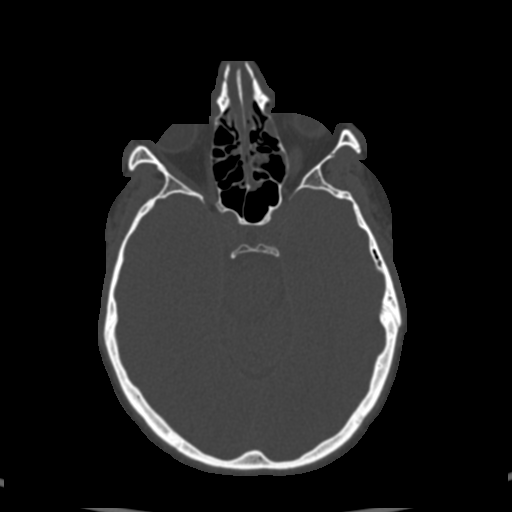
[im 82/183  bone]
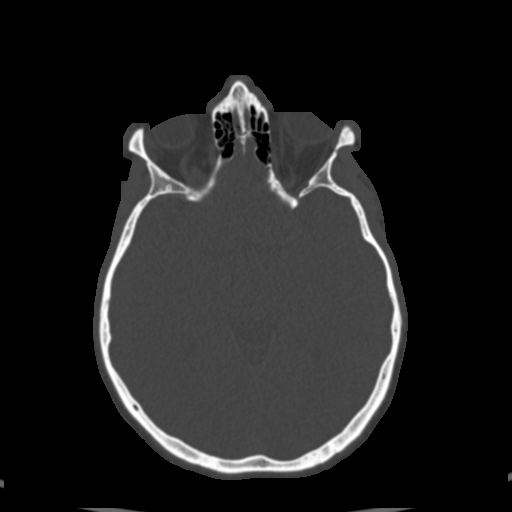
[im 95/183  bone]
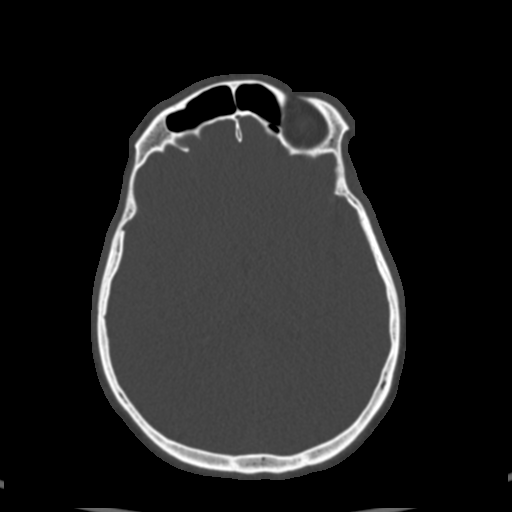
[im 101/183  brain]
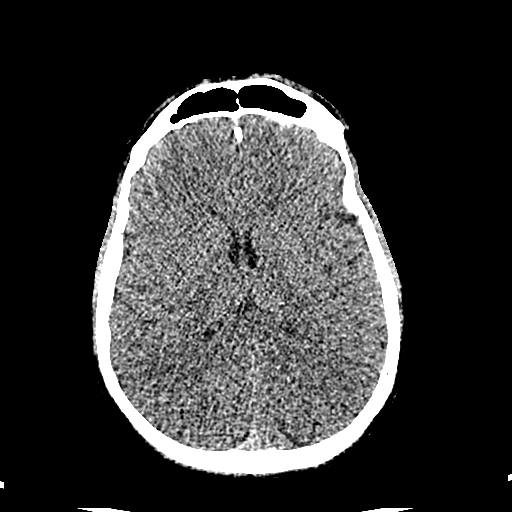
[im 101/183  bone]
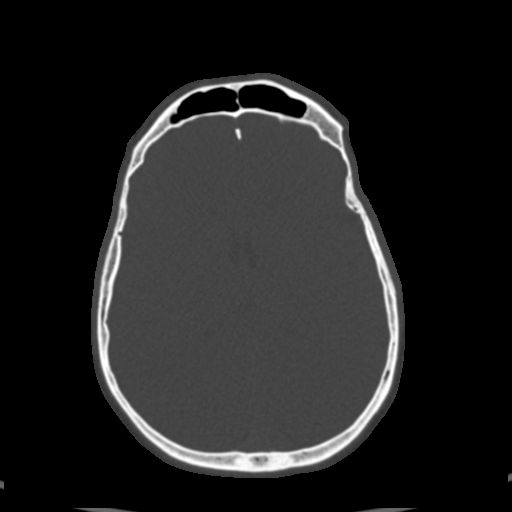
[im 113/183  bone]
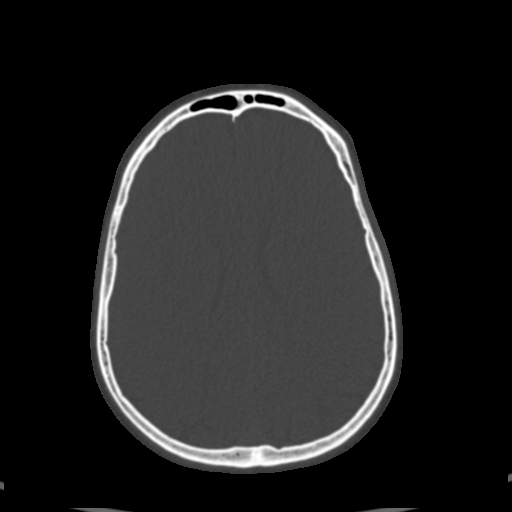
[im 126/183  bone]
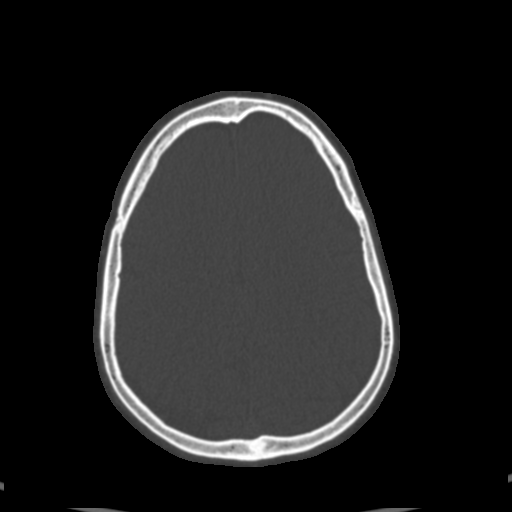
[im 139/183  bone]
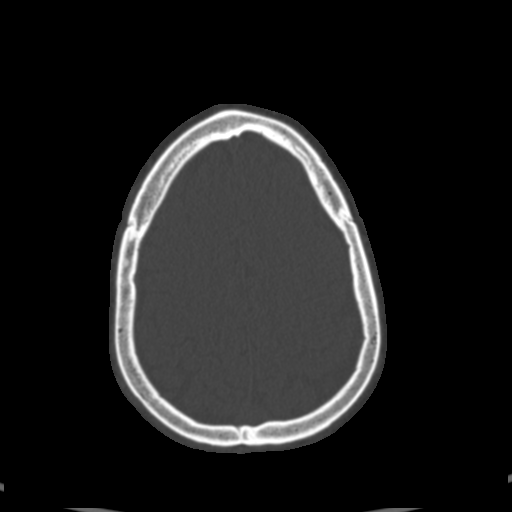
[im 151/183  brain]
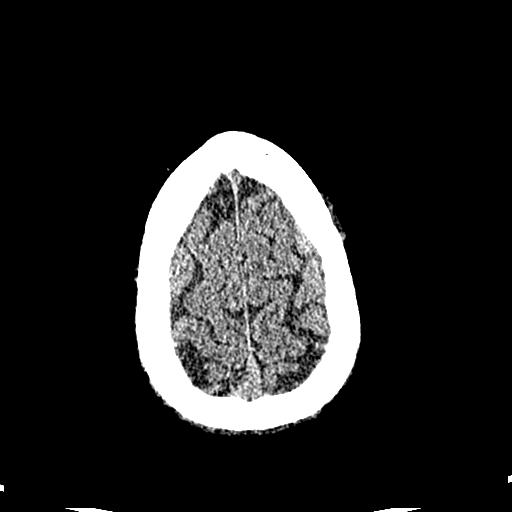
[im 151/183  bone]
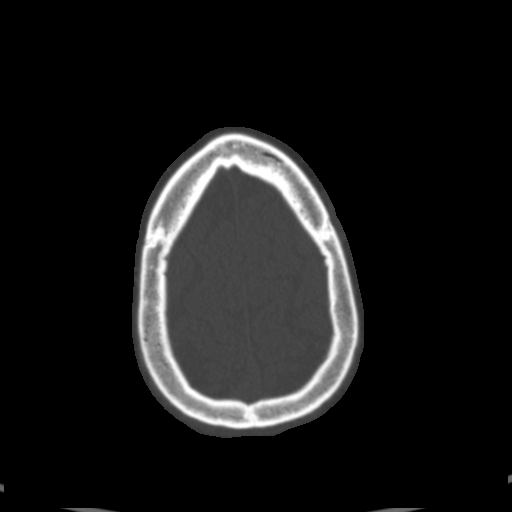
[im 164/183  bone]
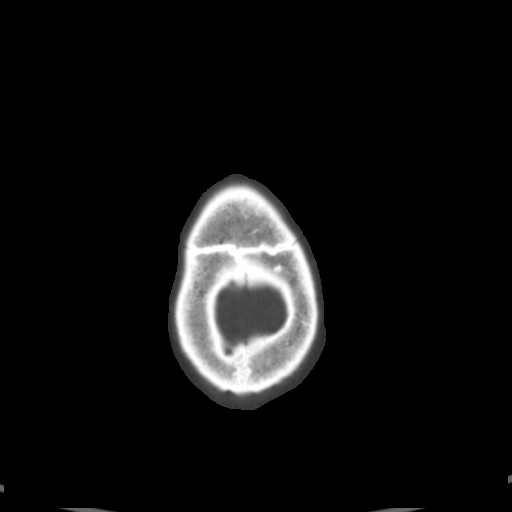
[im 176/183  bone]
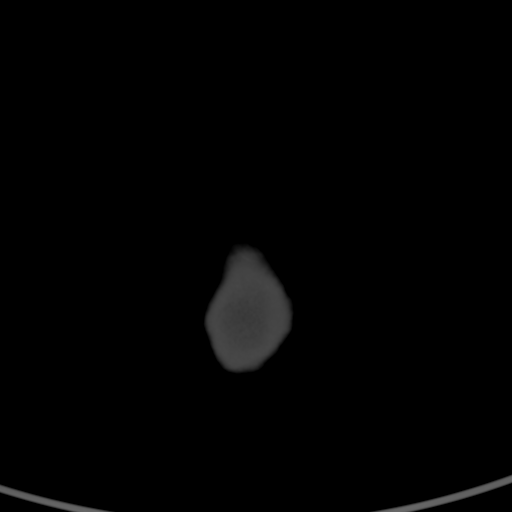

[15 of 30 positions shown; findings below may reference images not displayed]

FINDINGS: Paranasal sinuses:

Frontal: Normally aerated. Patent frontal sinus drainage pathways.

Ethmoid: Few scattered opacified ethmoid air cells on each side.

Maxillary: Right maxillary sinus contains a 3 cm retention cyst at
the floor but there is no widespread mucosal thickening or layering
fluid. There is mild mucosal thickening at the infundibular origin.
Left maxillary sinus shows circumferential mucosal thickening with
small mucosal cysts. No layering fluid.

Sphenoid: No widespread inflammatory change. Mild mucosal thickening
at the sphenoid ethmoid recesses.

Right ostiomeatal unit: Infundibulum widely patent. Mild adjacent
mucosal thickening. No unfavorable variant.

Left ostiomeatal unit: Infundibulum is opacified secondary to
mucosal thickening. No discernible anatomic variant.

Nasal passages: Patent. Intact nasal septum is midline.

Anatomy: No pneumatization superior to anterior ethmoid notches.
Symmetric and intact olfactory grooves and fovea ethmoidalis, Keros
II (4-7mm) Conchal sphenoid pneumatization pattern. No dehiscence of
carotid or optic canals. No onodi cell.

Other: None significant
IMPRESSION: The dominant finding in this case is that of mucosal thickening in
the region of both ostiomeatal units, much more pronounced on the
left than the right. Left maxillary sinus shows circumferential
mucosal thickening with mucosal cysts. The right maxillary sinus is
otherwise clear besides a 3 cm retention cyst at the floor.

Scattered opacified ethmoid air cells on each side.

## 2020-03-27 MED FILL — NovoLOG 100 UNIT/ML SOLN: 100 | 90 days supply | Qty: 90 | Fill #0

## 2020-06-11 ENCOUNTER — Telehealth: Payer: BLUE CROSS/BLUE SHIELD | Admitting: Family

## 2020-06-11 DIAGNOSIS — U071 COVID-19: Secondary | ICD-10-CM

## 2020-06-11 MED ORDER — ALBUTEROL SULFATE HFA 108 (90 BASE) MCG/ACT IN AERS: 2.0000 | INHALATION_SPRAY | Freq: Four times a day (QID) | RESPIRATORY_TRACT | 0 refills | Status: AC | PRN

## 2020-06-11 MED ORDER — BENZONATATE 100 MG PO CAPS: 100.0000 mg | ORAL_CAPSULE | Freq: Three times a day (TID) | ORAL | 0 refills | Status: AC | PRN

## 2020-06-11 MED ORDER — FLUTICASONE PROPIONATE 50 MCG/ACT NA SUSP: 2.0000 | Freq: Every day | NASAL | 6 refills | Status: AC

## 2020-06-11 NOTE — Progress Notes (Signed)
E-Visit for Corona Virus Screening  We are sorry you are not feeling well. We are here to help!  You have tested positive for COVID-19, meaning that you were infected with the novel coronavirus and could give the virus to others.  It is vitally important that you stay home so you do not spread it to others.      Please continue isolation at home, for at least 10 days since the start of your symptoms and until you have had 24 hours with no fever (without taking a fever reducer) and with improving of symptoms.  If you have no symptoms but tested positive (or all symptoms resolve after 5 days and you have no fever) you can leave your house but continue to wear a mask around others for an additional 5 days. If you have a fever,continue to stay home until you have had 24 hours of no fever. Most cases improve 5-10 days from onset but we have seen a small number of patients who have gotten worse after the 10 days.  Please be sure to watch for worsening symptoms and remain taking the proper precautions.   Go to the nearest hospital ED for assessment if fever/cough/breathlessness are severe or illness seems like a threat to life.    The following symptoms may appear 2-14 days after exposure: . Fever . Cough . Shortness of breath or difficulty breathing . Chills . Repeated shaking with chills . Muscle pain . Headache . Sore throat . New loss of taste or smell . Fatigue . Congestion or runny nose . Nausea or vomiting . Diarrhea  You have been enrolled in [facility] Monitoring for COVID-19. Daily you will receive a questionnaire within the MyChart website. Our COVID-19 response team will be monitoring your responses daily.  You can use medication such as A prescription cough medication called Tessalon Perles 100 mg. You may take 1-2 capsules every 8 hours as needed for cough, A prescription inhaler called Albuterol MDI 90 mcg /actuation 2 puffs every 4 hours as needed for shortness of breath,  wheezing, cough and A prescription for Fluticasone nasal spray 2 sprays in each nostril one time per day  We have asked the monoclonal antibody team contact you to discuss the infusion with you and potentially set this up. You should hear from them in the next 48 hours.  There is a Sport and exercise psychologist of this medication, and unsure if you will be able to receive, but I have placed the referral.    You may also take acetaminophen (Tylenol) as needed for fever.  HOME CARE: . Only take medications as instructed by your medical team. . Drink plenty of fluids and get plenty of rest. . A steam or ultrasonic humidifier can help if you have congestion.   GET HELP RIGHT AWAY IF YOU HAVE EMERGENCY WARNING SIGNS.  Call 911 or proceed to your closest emergency facility if: . You develop worsening high fever. . Trouble breathing . Bluish lips or face . Persistent pain or pressure in the chest . New confusion . Inability to wake or stay awake . You cough up blood. . Your symptoms become more severe . Inability to hold down food or fluids  This list is not all possible symptoms. Contact your medical provider for any symptoms that are severe or concerning to you.    Your e-visit answers were reviewed by a board certified advanced clinical practitioner to complete your personal care plan.  Depending on the condition, your plan  could have included both over the counter or prescription medications.  If there is a problem please reply once you have received a response from your provider.  Your safety is important to [place].  If you have drug allergies check your prescription carefully.    You can use MyChart to ask questions about today's visit, request a non-urgent call back, or ask for a work or school excuse for 24 hours related to this e-Visit. If it has been greater than 24 hours you will need to follow up with your provider, or enter a new e-Visit to address those concerns. You will get an e-mail in the  next two days asking about your experience.  I hope that your e-visit has been valuable and will speed your recovery. Thank you for using e-visits.   Approximately 5 minutes was spent documenting and reviewing patient's chart.

## 2020-07-15 ENCOUNTER — Other Ambulatory Visit (HOSPITAL_COMMUNITY): Payer: Self-pay | Admitting: Internal Medicine

## 2020-12-04 ENCOUNTER — Other Ambulatory Visit (HOSPITAL_COMMUNITY): Payer: Self-pay

## 2020-12-04 MED ORDER — ATORVASTATIN CALCIUM 20 MG PO TABS
20.0000 mg | ORAL_TABLET | Freq: Every day | ORAL | 2 refills | Status: AC
  Filled 2020-12-04: qty 30, 30d supply, fill #0

## 2020-12-04 MED ORDER — CONTOUR NEXT TEST VI STRP
ORAL_STRIP | 0 refills | Status: AC
  Filled 2020-12-04: qty 100, 31d supply, fill #0

## 2020-12-05 ENCOUNTER — Other Ambulatory Visit (HOSPITAL_COMMUNITY): Payer: Self-pay

## 2020-12-15 ENCOUNTER — Other Ambulatory Visit (HOSPITAL_COMMUNITY): Payer: Self-pay

## 2022-12-18 ENCOUNTER — Other Ambulatory Visit (HOSPITAL_COMMUNITY): Payer: Self-pay
